# Patient Record
Sex: Female | Born: 1985 | Race: Black or African American | Hispanic: No | Marital: Single | Smoking: Never smoker
Health system: Southern US, Community
[De-identification: ages and names within clinical notes are randomized; demographics above are authoritative.]

---
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
## 2004-05-28 ENCOUNTER — Emergency Department (HOSPITAL_COMMUNITY): Admission: EM | Admit: 2004-05-28 | Discharge: 2004-05-28 | Payer: Self-pay | Admitting: Emergency Medicine

## 2007-08-09 ENCOUNTER — Emergency Department (HOSPITAL_COMMUNITY): Admission: EM | Admit: 2007-08-09 | Discharge: 2007-08-10 | Payer: Self-pay | Admitting: Emergency Medicine

## 2008-01-20 ENCOUNTER — Emergency Department (HOSPITAL_COMMUNITY): Admission: EM | Admit: 2008-01-20 | Discharge: 2008-01-20 | Payer: Self-pay | Admitting: Emergency Medicine

## 2008-06-09 ENCOUNTER — Emergency Department (HOSPITAL_COMMUNITY): Admission: EM | Admit: 2008-06-09 | Discharge: 2008-06-09 | Payer: Self-pay | Admitting: Emergency Medicine

## 2009-02-01 ENCOUNTER — Emergency Department (HOSPITAL_COMMUNITY): Admission: EM | Admit: 2009-02-01 | Discharge: 2009-02-01 | Payer: Self-pay | Admitting: Emergency Medicine

## 2009-03-23 ENCOUNTER — Encounter: Admission: RE | Admit: 2009-03-23 | Discharge: 2009-06-21 | Payer: Self-pay | Admitting: Family Medicine

## 2010-12-11 LAB — URINALYSIS, ROUTINE W REFLEX MICROSCOPIC
Bilirubin Urine: NEGATIVE
Ketones, ur: NEGATIVE mg/dL
Nitrite: NEGATIVE
Specific Gravity, Urine: 1.018 (ref 1.005–1.030)
Urobilinogen, UA: 1 mg/dL (ref 0.0–1.0)
pH: 5.5 (ref 5.0–8.0)

## 2015-01-10 ENCOUNTER — Emergency Department (HOSPITAL_COMMUNITY)
Admission: EM | Admit: 2015-01-10 | Discharge: 2015-01-10 | Discharge status: Absent without leave | Payer: PRIVATE HEALTH INSURANCE | Source: Home / Self Care

## 2017-10-24 ENCOUNTER — Emergency Department (HOSPITAL_COMMUNITY): Payer: BLUE CROSS/BLUE SHIELD

## 2017-10-24 ENCOUNTER — Encounter (HOSPITAL_COMMUNITY): Payer: Self-pay | Admitting: *Deleted

## 2017-10-24 ENCOUNTER — Other Ambulatory Visit: Payer: Self-pay

## 2017-10-24 ENCOUNTER — Emergency Department (HOSPITAL_COMMUNITY)
Admission: EM | Admit: 2017-10-24 | Discharge: 2017-10-24 | Disposition: A | Discharge status: Home / Self Care | Payer: BLUE CROSS/BLUE SHIELD | Attending: Emergency Medicine | Admitting: Emergency Medicine

## 2017-10-24 DIAGNOSIS — Y929 Unspecified place or not applicable: Secondary | ICD-10-CM | POA: Insufficient documentation

## 2017-10-24 DIAGNOSIS — M25532 Pain in left wrist: Secondary | ICD-10-CM | POA: Diagnosis not present

## 2017-10-24 DIAGNOSIS — Y939 Activity, unspecified: Secondary | ICD-10-CM | POA: Diagnosis not present

## 2017-10-24 DIAGNOSIS — M25512 Pain in left shoulder: Secondary | ICD-10-CM | POA: Insufficient documentation

## 2017-10-24 DIAGNOSIS — M79642 Pain in left hand: Secondary | ICD-10-CM | POA: Diagnosis not present

## 2017-10-24 DIAGNOSIS — M7918 Myalgia, other site: Secondary | ICD-10-CM

## 2017-10-24 DIAGNOSIS — Y999 Unspecified external cause status: Secondary | ICD-10-CM | POA: Diagnosis not present

## 2017-10-24 MED ORDER — IBUPROFEN 800 MG PO TABS
800.0000 mg | ORAL_TABLET | Freq: Once | ORAL | Status: AC
Start: 2017-10-24 — End: 2017-10-24
  Administered 2017-10-24: 800 mg via ORAL
  Filled 2017-10-24: qty 1

## 2017-10-24 MED ORDER — CYCLOBENZAPRINE HCL 10 MG PO TABS: 10.0000 mg | ORAL_TABLET | Freq: Two times a day (BID) | ORAL | 0 refills | Status: AC | PRN
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
## 2017-10-24 NOTE — ED Notes (Signed)
Patient understands discharge. Signature pad not working  
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
## 2017-10-24 NOTE — Progress Notes (Signed)
Orthopedic Tech Progress Note Patient Details:  Arnell AsalMichelle Hochstatter [birth year] [id number]  Ortho Devices Type of Ortho Device: Thumb velcro splint, Arm sling Ortho Device/Splint Location: lue Ortho Device/Splint Interventions: Application   Post Interventions Patient Tolerated: Well Instructions Provided: Care of device   Nikki DomCrawford, Sherida Dobkins 10/24/2017, 3:08 PM

## 2017-10-24 NOTE — Discharge Instructions (Signed)
Please read attached information. If you experience any new or worsening signs or symptoms please return to the emergency room for evaluation. Please follow-up with your primary care provider or specialist as discussed. Please use medication prescribed only as directed and discontinue taking if you have any concerning signs or symptoms.   °

## 2017-11-15 ENCOUNTER — Other Ambulatory Visit: Payer: Self-pay | Admitting: Orthopedic Surgery

## 2017-11-15 DIAGNOSIS — G8929 Other chronic pain: Secondary | ICD-10-CM

## 2017-11-15 DIAGNOSIS — M25512 Pain in left shoulder: Principal | ICD-10-CM

## 2017-11-23 ENCOUNTER — Encounter (INDEPENDENT_AMBULATORY_CARE_PROVIDER_SITE_OTHER): Payer: Self-pay

## 2017-11-23 ENCOUNTER — Ambulatory Visit (HOSPITAL_COMMUNITY)
Admission: RE | Admit: 2017-11-23 | Discharge: 2017-11-23 | Disposition: A | Discharge status: Home / Self Care | Payer: BLUE CROSS/BLUE SHIELD | Source: Ambulatory Visit | Attending: Orthopedic Surgery | Admitting: Orthopedic Surgery

## 2017-11-23 DIAGNOSIS — G8929 Other chronic pain: Secondary | ICD-10-CM | POA: Diagnosis present

## 2017-11-23 DIAGNOSIS — M7582 Other shoulder lesions, left shoulder: Secondary | ICD-10-CM | POA: Insufficient documentation

## 2017-11-23 DIAGNOSIS — M25512 Pain in left shoulder: Secondary | ICD-10-CM | POA: Diagnosis present

## 2017-11-25 ENCOUNTER — Ambulatory Visit (HOSPITAL_COMMUNITY): Payer: PRIVATE HEALTH INSURANCE
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
